# Patient Record
Sex: Male | Born: 2000 | Race: White | Hispanic: Yes | Marital: Single | Smoking: Never smoker
Health system: Southern US, Community
[De-identification: ages and names within clinical notes are randomized; demographics above are authoritative.]

## PROBLEM LIST (undated history)

## (undated) HISTORY — PX: TONSILLECTOMY AND ADENOIDECTOMY: SUR1326

---
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
## 2021-05-03 ENCOUNTER — Other Ambulatory Visit: Payer: Self-pay

## 2021-05-03 ENCOUNTER — Encounter (HOSPITAL_COMMUNITY): Payer: Self-pay | Admitting: Emergency Medicine

## 2021-05-03 ENCOUNTER — Ambulatory Visit (HOSPITAL_COMMUNITY)
Admission: EM | Admit: 2021-05-03 | Discharge: 2021-05-03 | Disposition: A | Discharge status: Home / Self Care | Payer: Medicaid Other | Attending: Emergency Medicine | Admitting: Emergency Medicine

## 2021-05-03 DIAGNOSIS — R519 Headache, unspecified: Secondary | ICD-10-CM

## 2021-05-03 MED ORDER — KETOROLAC TROMETHAMINE 30 MG/ML IJ SOLN
30.0000 mg | Freq: Once | INTRAMUSCULAR | Status: AC
  Administered 2021-05-03: 30 mg via INTRAMUSCULAR

## 2021-05-03 MED ORDER — KETOROLAC TROMETHAMINE 30 MG/ML IJ SOLN
INTRAMUSCULAR | Status: AC
  Filled 2021-05-03: qty 1

## 2021-05-03 NOTE — ED Triage Notes (Addendum)
Patient complains of having a headache for one week.  Patient says he wakes with headache  Complains of mood swings for a week.  Patient reports anger and sadness  Patient reports he has not tried any medications for headache

## 2021-05-03 NOTE — Discharge Instructions (Signed)
Drink at least 64 ounces of water a day. You can continue to take Tylenol and ibuprofen alternating at home.

## 2021-05-03 NOTE — ED Provider Notes (Signed)
[facility]  ____________________________________________  Time seen: Approximately 11:54 AM  I have reviewed the triage vital signs and the nursing notes.   HISTORY  Chief Complaint Headache   Historian Patient     HPI Daniel Landry is a 20 y.o. male presents to the urgent care with a headache that has occurred intermittently over the past week.  Patient reports that headaches are causing him to have mood swings and to feel sad.  Patient reports that he had to miss work due to headache.  He denies homicidal or suicidal ideation.  Denies history of headache.  States he is only drinking 2-3 bottles of water a day.  No falls or mechanisms of trauma.  No prior history of headaches in the past.  No fever, pharyngitis, nausea, vomiting or diarrhea.  No chest pain, chest tightness or abdominal pain.  Patient cannot recall his last eye exam.  He states he normally drinks water or juice and does not drink caffeine often.  He states that he has not attempted any alleviating measures at home including Tylenol and ibuprofen for headache.  History reviewed. No pertinent past medical history.   Immunizations up to date:  Yes.     History reviewed. No pertinent past medical history.  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 Authorizing Provider  naproxen (NAPROSYN) 500 MG tablet Take 1 tablet twice daily as needed for body aches or fever. Patient not taking: Reported on 05/03/2021 07/10/20   Molpus, Jonny Ruiz, MD  ondansetron (ZOFRAN ODT) 8 MG disintegrating tablet Take 1 tablet (8 mg total) by mouth every 8 (eight) hours as  needed for nausea or vomiting. 07/10/20   Molpus, Jonny Ruiz, MD    Allergies Patient has no known allergies.  Family History  Problem Relation Age of Onset   Hearing loss Maternal Grandfather        adult onset   Vision loss Maternal Grandfather 63       glasses    Social History Social History   Tobacco Use   Smoking status: Never   Smokeless tobacco: Never  Vaping Use   Vaping Use: Never used  Substance Use Topics   Alcohol use: Yes    Comment: rare   Drug use: Yes    Types: Marijuana     Review of Systems  Constitutional: No fever/chills Eyes:  No discharge ENT: No upper respiratory complaints. Respiratory: no cough. No SOB/ use of accessory muscles to breath Gastrointestinal:   No nausea, no vomiting.  No diarrhea.  No constipation. Musculoskeletal: Negative for musculoskeletal pain. Neuro: Patient has headache.  Skin: Negative for rash, abrasions, lacerations, ecchymosis.    ____________________________________________   PHYSICAL EXAM:  VITAL SIGNS: ED Triage Vitals  Enc Vitals Group     BP 05/03/21 1121 129/71     Pulse Rate 05/03/21 1121 63     Resp 05/03/21 1121 19     Temp 05/03/21 1121 98.6 F (37 C)     Temp Source 05/03/21 1121 Oral     SpO2 05/03/21 1121 99 %     Weight --      Height --  Head Circumference --      Peak Flow --      Pain Score 05/03/21 1117 10     Pain Loc --      Pain Edu? --      Excl. in GC? --      Constitutional: Alert and oriented. Well appearing and in no acute distress. Eyes: Conjunctivae are normal. PERRL. EOMI. Head: Atraumatic.  No frontal or maxillary sinus tenderness. ENT:      Nose: No congestion/rhinnorhea.      Mouth/Throat: Mucous membranes are moist.  Neck: No stridor.  No cervical spine tenderness to palpation. Cardiovascular: Normal rate, regular rhythm. Normal S1 and S2.  Good peripheral circulation. Respiratory: Normal respiratory effort without tachypnea or retractions. Lungs CTAB. Good air  entry to the bases with no decreased or absent breath sounds Gastrointestinal: Bowel sounds x 4 quadrants. Soft and nontender to palpation. No guarding or rigidity. No distention. Musculoskeletal: Full range of motion to all extremities. No obvious deformities noted Neurologic:  Normal for age. No gross focal neurologic deficits are appreciated.  Skin:  Skin is warm, dry and intact. No rash noted. Psychiatric: Mood and affect are normal for age. Speech and behavior are normal.   ____________________________________________   LABS (all labs ordered are listed, but only abnormal results are displayed)  Labs Reviewed - No data to display ____________________________________________  EKG   ____________________________________________  RADIOLOGY   No results found.  ____________________________________________    PROCEDURES  Procedure(s) performed:     Procedures     Medications  ketorolac (TORADOL) 30 MG/ML injection 30 mg (has no administration in time range)     ____________________________________________   INITIAL IMPRESSION / ASSESSMENT AND PLAN / ED COURSE  Pertinent labs & imaging results that were available during my care of the patient were reviewed by me and considered in my medical decision making (see chart for details).    Assessment and plan Headache 20 year old male presents to the emergency department with intermittent headache over the past week.  Vital signs are reassuring at triage.  No neurodeficits on exam.  We will treat with IM Toradol and recommend Tylenol and ibuprofen alternating at home.  Recommended increase hydration during work hours as patient works outside during the day.  Also recommended a yearly eye exam.  Return precautions were given to return with new or worsening symptoms.      ____________________________________________  FINAL CLINICAL IMPRESSION(S) / ED DIAGNOSES  Final diagnoses:  Acute nonintractable headache,  unspecified headache type      NEW MEDICATIONS STARTED DURING THIS VISIT:  ED Discharge Orders     None           This chart was dictated using voice recognition software/Dragon. Despite best efforts to proofread, errors can occur which can change the meaning. Any change was purely unintentional.     Orvil Feil, PA-C 05/03/21 1158

## 2022-04-06 ENCOUNTER — Ambulatory Visit (INDEPENDENT_AMBULATORY_CARE_PROVIDER_SITE_OTHER): Payer: Medicaid Other

## 2022-04-06 ENCOUNTER — Ambulatory Visit (HOSPITAL_COMMUNITY)
Admission: EM | Admit: 2022-04-06 | Discharge: 2022-04-06 | Disposition: A | Discharge status: Home / Self Care | Payer: Medicaid Other | Attending: Family Medicine | Admitting: Family Medicine

## 2022-04-06 DIAGNOSIS — M79645 Pain in left finger(s): Secondary | ICD-10-CM | POA: Diagnosis not present

## 2022-04-06 DIAGNOSIS — S6992XA Unspecified injury of left wrist, hand and finger(s), initial encounter: Secondary | ICD-10-CM

## 2022-04-06 DIAGNOSIS — S61201A Unspecified open wound of left index finger without damage to nail, initial encounter: Secondary | ICD-10-CM | POA: Diagnosis not present

## 2022-04-06 DIAGNOSIS — S61209A Unspecified open wound of unspecified finger without damage to nail, initial encounter: Secondary | ICD-10-CM

## 2022-04-06 MED ORDER — TETANUS-DIPHTH-ACELL PERTUSSIS 5-2.5-18.5 LF-MCG/0.5 IM SUSY
0.5000 mL | PREFILLED_SYRINGE | Freq: Once | INTRAMUSCULAR | Status: AC
  Administered 2022-04-06: 0.5 mL via INTRAMUSCULAR

## 2022-04-06 MED ORDER — TETANUS-DIPHTH-ACELL PERTUSSIS 5-2.5-18.5 LF-MCG/0.5 IM SUSY
PREFILLED_SYRINGE | INTRAMUSCULAR | Status: AC
  Filled 2022-04-06: qty 0.5

## 2022-04-06 NOTE — ED Triage Notes (Signed)
Pt injured his left-hand index finger. 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

## 2022-04-06 NOTE — Discharge Instructions (Addendum)
Apply ice to finger. Take tylenol and or ibuprofen. Apply neosporin twice daily and change bandage. Continue treatment for 5-7 days or until wound completely heals.

## 2022-04-06 NOTE — ED Provider Notes (Signed)
Daniel Landry    CSN: [id number] Arrival date & time: 04/06/22  1514      History   Chief Complaint Chief Complaint  Patient presents with   Finger Injury    HPI Daniel Landry is a 21 y.o. male.   HPI Patient presents for evaluation of left index finger injury.  To clarify, patient injured finger with his nail gun.  He reports, the nail from the nail gun punctured the tip of his left finger. His last tetanus was over 7 years old.  He denies any loss of sensation involving the left index finger.  No past medical history on file.  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 Authorizing Provider  naproxen (NAPROSYN) 500 MG tablet Take 1 tablet twice daily as needed for body aches or fever. Patient not taking: Reported on 05/03/2021 07/10/20   Molpus, Jonny Ruiz, MD  ondansetron (ZOFRAN ODT) 8 MG disintegrating tablet Take 1 tablet (8 mg total) by mouth every 8 (eight) hours as needed for nausea or vomiting. 07/10/20   Molpus, John, MD    Family History Family History  Problem Relation Age of Onset   Hearing loss Maternal Grandfather        adult onset   Vision loss Maternal Grandfather 73       glasses    Social History Social History   Tobacco Use   Smoking status: Never   Smokeless tobacco: Never  Vaping Use   Vaping Use: Never used  Substance Use Topics   Alcohol use: Yes    Comment: rare   Drug use: Yes    Types: Marijuana     Allergies   Patient has no known allergies.   Review of Systems Review of Systems Pertinent negatives listed in HPI   Physical  Exam Triage Vital Signs ED Triage Vitals  Enc Vitals Group     BP 04/06/22 1632 117/61     Pulse Rate 04/06/22 1630 71     Resp 04/06/22 1630 16     Temp 04/06/22 1630 98.8 F (37.1 C)     Temp src --      SpO2 04/06/22 1630 98 %     Weight --      Height --      Head Circumference --      Peak Flow --      Pain Score --      Pain Loc --      Pain Edu? --      Excl. in GC? --    No data found.  Updated Vital Signs BP 117/61 (BP Location: Left Arm)   Pulse 71   Temp 98.8 F (37.1 C)   Resp 16   SpO2 100%   Visual Acuity Right Eye Distance:   Left Eye Distance:   Bilateral Distance:    Right Eye Near:   Left Eye Near:    Bilateral Near:     Physical Exam Vitals reviewed.  Constitutional:      Appearance: Normal appearance.  HENT:     Head: Normocephalic and  atraumatic.  Eyes:     Extraocular Movements: Extraocular movements intact.     Pupils: Pupils are equal, round, and reactive to light.  Cardiovascular:     Rate and Rhythm: Normal rate and regular rhythm.  Pulmonary:     Effort: Pulmonary effort is normal.     Breath sounds: Normal breath sounds.  Musculoskeletal:       Arms:  Neurological:     General: No focal deficit present.     Mental Status: He is alert and oriented to person, place, and time.  Psychiatric:        Mood and Affect: Mood normal.        Behavior: Behavior normal.        Thought Content: Thought content normal.        Judgment: Judgment normal.   UC Treatments / Results  Labs (all labs ordered are listed, but only abnormal results are displayed) Labs Reviewed - No data to display  EKG   Radiology DG Hand Complete Left  Result Date: 04/06/2022 CLINICAL DATA:  Injury left index finger EXAM: LEFT HAND - COMPLETE 3+ VIEW COMPARISON:  None Available. FINDINGS: There is no evidence of fracture or dislocation. There is no evidence of arthropathy or other focal bone abnormality. Soft tissues are unremarkable. IMPRESSION:  Negative. Electronically Signed   By: Marlan Palau M.D.   On: 04/06/2022 17:08    Procedures Procedures (including critical care time)  Medications Ordered in UC Medications  Tdap (BOOSTRIX) injection 0.5 mL (0.5 mLs Intramuscular Given 04/06/22 1746)    Initial Impression / Assessment and Plan / UC Course  I have reviewed the triage vital signs and the nursing notes.  Pertinent labs & imaging results that were available during my care of the patient were reviewed by me and considered in my medical decision making (see chart for details).    Injury of left hand and avulsion of skin of finger No repair warranted. TDAP updated. Apply neosporin to wound twice daily. Monitor for signs of infection. Return as needed. Final Clinical Impressions(s) / UC Diagnoses   Final diagnoses:  Injury of finger of left hand, initial encounter  Avulsion of skin of finger, initial encounter     Discharge Instructions      Apply ice to finger. Take tylenol and or ibuprofen. Apply neosporin twice daily and change bandage. Continue treatment for 5-7 days or until wound completely heals.    ED Prescriptions   None    PDMP not reviewed this encounter.   Bing Neighbors, FNP 04/07/22 [phone]

## 2022-05-14 ENCOUNTER — Ambulatory Visit (HOSPITAL_COMMUNITY): Payer: Self-pay

## 2022-05-14 ENCOUNTER — Encounter (HOSPITAL_COMMUNITY): Payer: Self-pay | Admitting: Emergency Medicine

## 2022-05-14 ENCOUNTER — Ambulatory Visit (HOSPITAL_COMMUNITY)
Admission: EM | Admit: 2022-05-14 | Discharge: 2022-05-14 | Disposition: A | Discharge status: Home / Self Care | Payer: Medicaid Other | Attending: Internal Medicine | Admitting: Internal Medicine

## 2022-05-14 DIAGNOSIS — K047 Periapical abscess without sinus: Secondary | ICD-10-CM | POA: Diagnosis not present

## 2022-05-14 DIAGNOSIS — K0889 Other specified disorders of teeth and supporting structures: Secondary | ICD-10-CM | POA: Diagnosis not present

## 2022-05-14 DIAGNOSIS — R220 Localized swelling, mass and lump, head: Secondary | ICD-10-CM | POA: Diagnosis not present

## 2022-05-14 MED ORDER — AMOXICILLIN-POT CLAVULANATE 875-125 MG PO TABS: 1.0000 | ORAL_TABLET | Freq: Two times a day (BID) | ORAL | 0 refills | Status: DC

## 2022-05-14 MED ORDER — IBUPROFEN 600 MG PO TABS: 600.0000 mg | ORAL_TABLET | Freq: Four times a day (QID) | ORAL | 0 refills | Status: AC | PRN

## 2022-05-14 NOTE — Discharge Instructions (Signed)
Take Augmentin antibiotic twice daily for the next 7 days to treat your suspected dental infection. Take ibuprofen 600 mg every 6 hours as needed with food for your dental pain and inflammation.  If you develop any new or worsening symptoms including shortness of breath, difficulty eating/breathing, difficulty swallowing, increasing warmth or drainage from your mouth, or increasing swelling, please return to urgent care.  If your symptoms are severe, please go to the emergency room.  I hope you feel better!  Call your dentist on Monday to schedule an appointment!

## 2022-05-14 NOTE — ED Provider Notes (Signed)
[facility]    CSN: [id number] Arrival date & time: 05/14/22  1037      History   Chief Complaint Chief Complaint  Patient presents with   Dental Pain   Facial Swelling    HPI Daniel Landry is a 21 y.o. male.   Patient presents to urgent care for evaluation of dental pain and facial swelling that started yesterday.  Pain is to the front teeth on the left side.  Area of greatest tenderness is to the lateral aspect of the left nare.  He states that he notices increasing left cheek swelling as well since yesterday.  He denies shortness of breath, difficulty breathing, sore throat, difficulty swallowing, purulent drainage from mouth, bloody drainage from mouth, and difficulty eating.  No nausea, vomiting, neck pain, nasal congestion, cough, fever/chills, or dizziness reported.  He took Tylenol this morning for the pain without much relief.  He recently had a cavity filled in the area approximately 1 month ago.  Denies any other dental work, falls, or trauma to the mouth since then.  Pain is currently a 5 on a scale of 0-10 at this time to to the soft tissue immediately left of the nose.      History reviewed. No pertinent past medical history.  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 Authorizing Provider  amoxicillin-clavulanate (AUGMENTIN) 875-125 MG tablet Take 1 tablet by mouth every 12 (twelve) hours. 05/14/22  Yes Carlisle Beers, FNP  ibuprofen (ADVIL) 600 MG tablet Take 1 tablet (600 mg total) by mouth every 6 (six) hours as needed. 05/14/22  Yes Carlisle Beers, FNP   ondansetron (ZOFRAN ODT) 8 MG disintegrating tablet Take 1 tablet (8 mg total) by mouth every 8 (eight) hours as needed for nausea or vomiting. 07/10/20   Molpus, John, MD    Family History Family History  Problem Relation Age of Onset   Hearing loss Maternal Grandfather        adult onset   Vision loss Maternal Grandfather 52       glasses    Social History Social History   Tobacco Use   Smoking status: Never   Smokeless tobacco: Never  Vaping Use   Vaping Use: Never used  Substance Use Topics   Alcohol use: Yes    Comment: rare   Drug use: Yes    Types: Marijuana     Allergies   Patient has no known allergies.   Review of Systems Review of Systems Per HPI  Physical Exam Triage Vital Signs ED Triage Vitals  Enc Vitals Group     BP 05/14/22 1155 137/79     Pulse Rate 05/14/22 1155 (!) 56     Resp 05/14/22 1155 18     Temp 05/14/22 1155 98 F (36.7 C)     Temp Source 05/14/22 1155 Oral     SpO2 05/14/22 1155 100 %     Weight --      Height --      Head Circumference --      Peak Flow --  Pain Score 05/14/22 1154 5     Pain Loc --      Pain Edu? --      Excl. in GC? --    No data found.  Updated Vital Signs BP 137/79 (BP Location: Left Arm)   Pulse (!) 56   Temp 98 F (36.7 C) (Oral)   Resp 18   SpO2 100%   Visual Acuity Right Eye Distance:   Left Eye Distance:   Bilateral Distance:    Right Eye Near:   Left Eye Near:    Bilateral Near:     Physical Exam Vitals and nursing note reviewed.  Constitutional:      Appearance: Normal appearance. He is not ill-appearing or toxic-appearing.     Comments: Very pleasant patient sitting on exam in position of comfort table in no acute distress.   HENT:     Head: Normocephalic and atraumatic.     Right Ear: Hearing, tympanic membrane, ear canal and external ear normal.     Left Ear: Hearing, tympanic membrane, ear canal and external ear normal.     Nose:     Right Turbinates: Not swollen.      Left Turbinates: Not swollen.     Right Sinus: No maxillary sinus tenderness or frontal sinus tenderness.     Left Sinus: Maxillary sinus tenderness present. No frontal sinus tenderness.     Mouth/Throat:     Lips: Pink.     Mouth: Mucous membranes are moist. No oral lesions or angioedema.     Dentition: Dental tenderness present.     Pharynx: Uvula midline. No posterior oropharyngeal erythema.      Comments: Erythema to the oral mucosa of the frontal upper aspect of the mouth and gumline.  No abscess palpated to the upper aspect of the mouth near patient's area of greatest tenderness.  Area of patient's tooth ache is outlined in red above.  Area of greatest tenderness to the maxillofacial area is also outlined and read above to the left of the nose.  No redness, bruising, or drainage to the soft tissues of the face noticed. Mild maxillofacial swelling to the left side of the patient's face present.  Defer to image below for further detail.  Eyes:     General: Lids are normal. Vision grossly intact. Gaze aligned appropriately.     Extraocular Movements: Extraocular movements intact.     Conjunctiva/sclera: Conjunctivae normal.     Right eye: Right conjunctiva is not injected.     Left eye: Left conjunctiva is not injected.  Pulmonary:     Effort: Pulmonary effort is normal.  Abdominal:     Palpations: Abdomen is soft.  Musculoskeletal:     Cervical back: Neck supple.  Skin:    General: Skin is warm and dry.     Capillary Refill: Capillary refill takes less than 2 seconds.     Findings: No rash.  Neurological:     General: No focal deficit present.     Mental Status: He is alert and oriented to person, place, and time. Mental status is at baseline.     Cranial Nerves: No dysarthria or facial asymmetry.     Gait: Gait is intact.  Psychiatric:        Mood and Affect: Mood normal.        Speech: Speech normal.        Behavior: Behavior normal.        Thought Content: Thought content  normal.  Judgment: Judgment normal.          UC Treatments / Results  Labs (all labs ordered are listed, but only abnormal results are displayed) Labs Reviewed - No data to display  EKG   Radiology No results found.  Procedures Procedures (including critical care time)  Medications Ordered in UC Medications - No data to display  Initial Impression / Assessment and Plan / UC Course  I have reviewed the triage vital signs and the nursing notes.  Pertinent labs & imaging results that were available during my care of the patient were reviewed by me and considered in my medical decision making (see chart for details).  1.  Facial swelling and dental infection No palpable dental abscess to the oral mucosa. No pain with EOM exam.  No allergies to antibiotics or medications per patient.  Plan to treat for probable dental infection with Augmentin twice daily for the next 7 days.  Patient may take ibuprofen 600 mg every 6 hours as needed for inflammation and pain with food to avoid GI upset.  Patient to call his dentist on Monday to schedule an appointment for follow-up and further evaluation of tooth pain and possible dental infection.  Strict ED return precautions given.   Discussed physical exam and available lab work findings in clinic with patient.  Counseled patient regarding appropriate use of medications and potential side effects for all medications recommended or prescribed today. Discussed red flag signs and symptoms of worsening condition,when to call the PCP office, return to urgent care, and when to seek higher level of care in the emergency department. Patient verbalizes understanding and agreement with plan. All questions answered. Patient discharged in stable condition.  Final Clinical Impressions(s) / UC Diagnoses   Final diagnoses:  Facial swelling  Dental infection  Pain, dental     Discharge Instructions      Take Augmentin antibiotic twice daily for  the next 7 days to treat your suspected dental infection. Take ibuprofen 600 mg every 6 hours as needed with food for your dental pain and inflammation.  If you develop any new or worsening symptoms including shortness of breath, difficulty eating/breathing, difficulty swallowing, increasing warmth or drainage from your mouth, or increasing swelling, please return to urgent care.  If your symptoms are severe, please go to the emergency room.  I hope you feel better!  Call your dentist on Monday to schedule an appointment!     ED Prescriptions     Medication Sig Dispense Auth. Provider   ibuprofen (ADVIL) 600 MG tablet Take 1 tablet (600 mg total) by mouth every 6 (six) hours as needed. 30 tablet Talbot Grumbling, FNP   amoxicillin-clavulanate (AUGMENTIN) 875-125 MG tablet Take 1 tablet by mouth every 12 (twelve) hours. 14 tablet Talbot Grumbling, FNP      PDMP not reviewed this encounter.   Talbot Grumbling, [place] 05/14/22 1258

## 2022-05-14 NOTE — ED Triage Notes (Signed)
Pt presents with left side facial swelling and dental pain. States he had dental procedure a month ago and states the tooth may have chipped. Noticed facial swelling yesterday,

## 2022-05-20 ENCOUNTER — Encounter (HOSPITAL_COMMUNITY): Payer: Self-pay | Admitting: Emergency Medicine

## 2022-05-20 ENCOUNTER — Ambulatory Visit (HOSPITAL_COMMUNITY): Payer: Self-pay

## 2022-05-20 ENCOUNTER — Ambulatory Visit (HOSPITAL_COMMUNITY)
Admission: EM | Admit: 2022-05-20 | Discharge: 2022-05-20 | Disposition: A | Discharge status: Home / Self Care | Payer: Medicaid Other | Attending: Physician Assistant | Admitting: Physician Assistant

## 2022-05-20 DIAGNOSIS — Z760 Encounter for issue of repeat prescription: Secondary | ICD-10-CM

## 2022-05-20 DIAGNOSIS — K047 Periapical abscess without sinus: Secondary | ICD-10-CM | POA: Diagnosis not present

## 2022-05-20 MED ORDER — AMOXICILLIN-POT CLAVULANATE 875-125 MG PO TABS: 1.0000 | ORAL_TABLET | Freq: Two times a day (BID) | ORAL | 0 refills | Status: AC

## 2022-05-20 NOTE — ED Triage Notes (Signed)
Patient presents for medication refill.   Patient states he was seen previously at this clinic for mouth infection per patient statement.   Patient states the medicine that was given wasn't enough per prescription. Patient states the pharmacy did not dispense enough medication.   Patient was prescribed Augmentin, patient has taken 4 days worth.

## 2022-05-20 NOTE — ED Provider Notes (Signed)
[facility]    CSN: [id number] Arrival date & time: 05/20/22  1432      History   Chief Complaint Chief Complaint  Patient presents with   Medication Refill    HPI Daniel Landry is a 21 y.o. male.   Patient presents today requesting a refill of Augmentin.  He was seen 05/14/2022 at which point he had a dental infection was started on Augmentin twice daily.  Reports that he was only given 4 days of medication with last dose within the past 48 hours.  He tried to contact the pharmacy for the rest of his medication but they stated he needed to be seen.  He was evaluated by dentist who encouraged him to complete course of antibiotics.  Since starting medication patient has had improvement of symptoms.  He continues to have an area of drainage and swelling but this has become less painful.  Pain is rated 2 on a 0-10 pain scale, described as throbbing, no aggravating alleviating factors notified.  He is able to eat and drink normally.  He has been taking ibuprofen as prescribed with improvement of symptoms.  Denies any dysphagia, odynophagia, swelling of his throat, shortness of breath, fever.    History reviewed. No pertinent past medical history.  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 Authorizing Provider  amoxicillin-clavulanate (AUGMENTIN) 875-125 MG tablet Take 1 tablet by mouth every 12 (twelve) hours. 05/20/22   Donyea Beverlin, Noberto Retort, PA-C  ibuprofen (ADVIL) 600 MG tablet Take 1 tablet (600 mg total) by mouth every 6 (six) hours as needed. 05/14/22   Carlisle Beers, FNP  ondansetron  (ZOFRAN ODT) 8 MG disintegrating tablet Take 1 tablet (8 mg total) by mouth every 8 (eight) hours as needed for nausea or vomiting. 07/10/20   Molpus, John, MD    Family History Family History  Problem Relation Age of Onset   Hearing loss Maternal Grandfather        adult onset   Vision loss Maternal Grandfather 4       glasses    Social History Social History   Tobacco Use   Smoking status: Never   Smokeless tobacco: Never  Vaping Use   Vaping Use: Never used  Substance Use Topics   Alcohol use: Yes    Comment: rare   Drug use: Yes    Types: Marijuana     Allergies   Patient has no known allergies.   Review of Systems Review of Systems  Constitutional:  Positive for activity change. Negative for appetite change, fatigue and fever.  HENT:  Positive for dental problem and facial swelling. Negative for congestion, sinus pressure, sneezing, sore throat, trouble swallowing and voice change.   Respiratory:  Negative for cough and shortness of breath.   Cardiovascular:  Negative for chest pain.  Gastrointestinal:  Negative for abdominal pain, diarrhea, nausea and vomiting.     Physical Exam Triage Vital Signs ED Triage Vitals  Enc Vitals Group     BP 05/20/22 1533 (!) 126/57     Pulse Rate 05/20/22 1533 63  Resp 05/20/22 1533 16     Temp 05/20/22 1533 98.2 F (36.8 C)     Temp Source 05/20/22 1533 Oral     SpO2 05/20/22 1533 97 %     Weight --      Height --      Head Circumference --      Peak Flow --      Pain Score 05/20/22 1538 0     Pain Loc --      Pain Edu? --      Excl. in GC? --    No data found.  Updated Vital Signs BP (!) 126/57 (BP Location: Left Arm)   Pulse 63   Temp 98.2 F (36.8 C) (Oral)   Resp 16   SpO2 97%   Visual Acuity Right Eye Distance:   Left Eye Distance:   Bilateral Distance:    Right Eye Near:   Left Eye Near:    Bilateral Near:     Physical Exam Vitals reviewed.  Constitutional:      General: He is awake.      Appearance: Normal appearance. He is well-developed. He is not ill-appearing.     Comments: Very pleasant male appears stated age in no acute distress sitting comfortably in exam room  HENT:     Head: Normocephalic and atraumatic.     Mouth/Throat:     Dentition: Gingival swelling and dental abscesses present.     Pharynx: Uvula midline. No oropharyngeal exudate or posterior oropharyngeal erythema.      Comments: No evidence of Ludwig angina. Cardiovascular:     Rate and Rhythm: Normal rate and regular rhythm.     Heart sounds: Normal heart sounds, S1 normal and S2 normal. No murmur heard. Pulmonary:     Effort: Pulmonary effort is normal.     Breath sounds: Normal breath sounds. No stridor. No wheezing, rhonchi or rales.     Comments: Clear to auscultation bilaterally Abdominal:     Palpations: Abdomen is soft.     Tenderness: There is no abdominal tenderness.  Neurological:     Mental Status: He is alert.  Psychiatric:        Behavior: Behavior is cooperative.      UC Treatments / Results  Labs (all labs ordered are listed, but only abnormal results are displayed) Labs Reviewed - No data to display  EKG   Radiology No results found.  Procedures Procedures (including critical care time)  Medications Ordered in UC Medications - No data to display  Initial Impression / Assessment and Plan / UC Course  I have reviewed the triage vital signs and the nursing notes.  Pertinent labs & imaging results that were available during my care of the patient were reviewed by me and considered in my medical decision making (see chart for details).     Evidence of dental infection still present.  Discussed that we typically continue antibiotics for 7 to 10 days.  Patient has had 4 days of medication but has been unable to get the rest of prescription from pharmacy despite this having been written for 7 days.  We will send another 7-day prescription to pharmacy.  Recommended he  continue over-the-counter medications including ibuprofen and Tylenol.  He is to gargle with warm salt water.  Discussed that he should follow-up with a dentist for further evaluation.  If he develops any worsening symptoms including swelling of his throat, shortness of breath, dysphagia, odynophagia, fever he needs to be seen immediately.  Strict return precautions given.  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  Final Clinical Impressions(s) / UC Diagnoses   Final diagnoses:  Dental infection  Medication refill     Discharge Instructions      I have called in another week of your antibiotic.  Take this twice a day with food.  It can upset your stomach.  Gargle with warm salt water and use Tylenol ibuprofen for pain.  You should follow-up with a dentist as we discussed.  If you have any worsening symptoms including swelling of your throat, difficulty speaking, difficulty swallowing, shortness of breath you need to be seen immediately.     ED Prescriptions     Medication Sig Dispense Auth. Provider   amoxicillin-clavulanate (AUGMENTIN) 875-125 MG tablet Take 1 tablet by mouth every 12 (twelve) hours. 14 tablet Dorinda Stehr, Noberto Retort, PA-C      PDMP not reviewed this encounter.   Jeani Hawking, PA-C 05/20/22 1557

## 2022-05-20 NOTE — Discharge Instructions (Signed)
I have called in another week of your antibiotic.  Take this twice a day with food.  It can upset your stomach.  Gargle with warm salt water and use Tylenol ibuprofen for pain.  You should follow-up with a dentist as we discussed.  If you have any worsening symptoms including swelling of your throat, difficulty speaking, difficulty swallowing, shortness of breath you need to be seen immediately.

## 2023-06-21 ENCOUNTER — Ambulatory Visit
Admission: EM | Admit: 2023-06-21 | Discharge: 2023-06-21 | Disposition: A | Discharge status: Home / Self Care | Payer: Medicaid Other

## 2023-06-21 DIAGNOSIS — R112 Nausea with vomiting, unspecified: Secondary | ICD-10-CM | POA: Diagnosis not present

## 2023-06-21 DIAGNOSIS — R197 Diarrhea, unspecified: Secondary | ICD-10-CM | POA: Diagnosis not present

## 2023-06-21 MED ORDER — ONDANSETRON 4 MG PO TBDP: 4.0000 mg | ORAL_TABLET | Freq: Three times a day (TID) | ORAL | 0 refills | Status: AC | PRN

## 2023-06-21 MED ORDER — LOPERAMIDE HCL 2 MG PO CAPS: 2.0000 mg | ORAL_CAPSULE | Freq: Four times a day (QID) | ORAL | 0 refills | Status: AC | PRN

## 2023-06-21 MED ORDER — ONDANSETRON 4 MG PO TBDP
4.0000 mg | ORAL_TABLET | Freq: Once | ORAL | Status: AC
  Administered 2023-06-21: 4 mg via ORAL

## 2023-06-21 NOTE — ED Provider Notes (Signed)
[facility]    CSN: [id number] Arrival date & time: 06/21/23  1357      History   Chief Complaint No chief complaint on file.   HPI Daniel Landry is a 22 y.o. male.   Patient presenting today with new onset nausea vomiting and diarrhea since awaking this morning.  Denies fever, chills, abdominal pain, urinary symptoms, upper respiratory symptoms.  Tried some Pepto-Bismol but could not keep it down.  Currently not tolerating p.o. well.  Ate some [place] food last night and thinks this is what is causing symptoms.  No known sick contacts or new medications.    History reviewed. No pertinent past medical history.  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 Authorizing Provider  bismuth subsalicylate (PEPTO BISMOL) 262 MG chewable tablet Chew 524 mg by mouth as needed.   Yes [provider]  loperamide (IMODIUM) 2 MG capsule Take 1 capsule (2 mg total) by mouth 4 (four) times daily as needed for diarrhea or loose stools. 06/21/23  Yes Particia Nearing, PA-C  ondansetron (ZOFRAN-ODT) 4 MG disintegrating tablet Take 1 tablet (4 mg total) by mouth every 8 (eight) hours as needed for nausea or vomiting. 06/21/23  Yes Particia Nearing, PA-C  amoxicillin-clavulanate (AUGMENTIN) 875-125 MG tablet Take 1 tablet by mouth every 12 (twelve) hours. 05/20/22   Raspet, Noberto Retort, PA-C  ibuprofen (ADVIL) 600 MG tablet Take 1 tablet (600 mg total) by mouth every 6 (six) hours as needed. 05/14/22   Carlisle Beers, FNP  ondansetron (ZOFRAN ODT) 8 MG disintegrating tablet Take 1 tablet  (8 mg total) by mouth every 8 (eight) hours as needed for nausea or vomiting. 07/10/20   Molpus, John, MD    Family History Family History  Problem Relation Age of Onset   Hearing loss Maternal Grandfather        adult onset   Vision loss Maternal Grandfather 37       glasses    Social History Social History   Tobacco Use   Smoking status: Never   Smokeless tobacco: Never  Vaping Use   Vaping status: Never Used  Substance Use Topics   Alcohol use: Yes    Comment: rare   Drug use: Yes    Types: Marijuana     Allergies   Patient has no known allergies.   Review of Systems Review of Systems PER HPI  Physical Exam Triage Vital Signs ED Triage Vitals  Encounter Vitals Group     BP 06/21/23 1428 130/76     Systolic BP Percentile --      Diastolic BP Percentile --      Pulse Rate 06/21/23 1428 83     Resp 06/21/23 1428 15     Temp 06/21/23 1428 99.8 F (37.7 C)     Temp Source 06/21/23 1428 Oral     SpO2 06/21/23 1428 97 %     Weight --      Height --      Head Circumference --      Peak Flow --  Pain Score 06/21/23 1430 0     Pain Loc --      Pain Education --      Exclude from Growth Chart --    No data found.  Updated Vital Signs BP 130/76 (BP Location: Right Arm)   Pulse 83   Temp 99.8 F (37.7 C) (Oral)   Resp 15   SpO2 97%   Visual Acuity Right Eye Distance:   Left Eye Distance:   Bilateral Distance:    Right Eye Near:   Left Eye Near:    Bilateral Near:     Physical Exam Vitals and nursing note reviewed.  Constitutional:      Appearance: Normal appearance.  HENT:     Head: Atraumatic.     Mouth/Throat:     Mouth: Mucous membranes are moist.     Pharynx: Oropharynx is clear.  Eyes:     Extraocular Movements: Extraocular movements intact.     Conjunctiva/sclera: Conjunctivae normal.  Cardiovascular:     Rate and Rhythm: Normal rate and regular rhythm.  Pulmonary:     Effort: Pulmonary effort is normal.     Breath sounds:  Normal breath sounds.  Abdominal:     General: Bowel sounds are normal. There is no distension.     Palpations: Abdomen is soft.     Tenderness: There is no abdominal tenderness. There is no right CVA tenderness, left CVA tenderness or guarding.  Musculoskeletal:        General: Normal range of motion.     Cervical back: Normal range of motion and neck supple.  Skin:    General: Skin is warm and dry.  Neurological:     General: No focal deficit present.     Mental Status: He is oriented to person, place, and time.  Psychiatric:        Mood and Affect: Mood normal.        Thought Content: Thought content normal.        Judgment: Judgment normal.      UC Treatments / Results  Labs (all labs ordered are listed, but only abnormal results are displayed) Labs Reviewed - No data to display  EKG   Radiology No results found.  Procedures Procedures (including critical care time)  Medications Ordered in UC Medications  ondansetron (ZOFRAN-ODT) disintegrating tablet 4 mg (4 mg Oral Given 06/21/23 1502)    Initial Impression / Assessment and Plan / UC Course  I have reviewed the triage vital signs and the nursing notes.  Pertinent labs & imaging results that were available during my care of the patient were reviewed by me and considered in my medical decision making (see chart for details).     Vitals and exam reassuring with no red flag findings.  Zofran given in clinic for active nausea, Zofran, Imodium, brat diet, fluids.  Work note given.  Return for worsening symptoms.  Final Clinical Impressions(s) / UC Diagnoses   Final diagnoses:  Nausea vomiting and diarrhea   Discharge Instructions   None    ED Prescriptions     Medication Sig Dispense Auth. Provider   ondansetron (ZOFRAN-ODT) 4 MG disintegrating tablet Take 1 tablet (4 mg total) by mouth every 8 (eight) hours as needed for nausea or vomiting. 20 tablet Particia Nearing, [place]   loperamide (IMODIUM) 2  MG capsule Take 1 capsule (2 mg total) by mouth 4 (four) times daily as needed for diarrhea or loose stools. 12 capsule Particia Nearing, [place]  PDMP not reviewed this encounter.   Particia Nearing, [place] 06/21/23 1515

## 2023-06-21 NOTE — ED Triage Notes (Signed)
Pt c/o Nausea,vomiting, diarrhea since early this morning.

## 2023-07-04 ENCOUNTER — Emergency Department (HOSPITAL_COMMUNITY): Payer: Medicaid Other

## 2023-07-04 ENCOUNTER — Encounter (HOSPITAL_COMMUNITY): Payer: Self-pay

## 2023-07-04 ENCOUNTER — Emergency Department (HOSPITAL_COMMUNITY)
Admission: EM | Admit: 2023-07-04 | Discharge: 2023-07-04 | Disposition: A | Discharge status: Home / Self Care | Payer: Medicaid Other | Attending: Emergency Medicine | Admitting: Emergency Medicine

## 2023-07-04 ENCOUNTER — Other Ambulatory Visit: Payer: Self-pay

## 2023-07-04 DIAGNOSIS — S32018A Other fracture of first lumbar vertebra, initial encounter for closed fracture: Secondary | ICD-10-CM | POA: Diagnosis not present

## 2023-07-04 DIAGNOSIS — W11XXXA Fall on and from ladder, initial encounter: Secondary | ICD-10-CM | POA: Insufficient documentation

## 2023-07-04 DIAGNOSIS — S3992XA Unspecified injury of lower back, initial encounter: Secondary | ICD-10-CM | POA: Diagnosis present

## 2023-07-04 DIAGNOSIS — S32009A Unspecified fracture of unspecified lumbar vertebra, initial encounter for closed fracture: Secondary | ICD-10-CM | POA: Insufficient documentation

## 2023-07-04 DIAGNOSIS — S32010D Wedge compression fracture of first lumbar vertebra, subsequent encounter for fracture with routine healing: Secondary | ICD-10-CM | POA: Diagnosis not present

## 2023-07-04 DIAGNOSIS — M545 Low back pain, unspecified: Secondary | ICD-10-CM | POA: Diagnosis not present

## 2023-07-04 DIAGNOSIS — S32010A Wedge compression fracture of first lumbar vertebra, initial encounter for closed fracture: Secondary | ICD-10-CM | POA: Diagnosis not present

## 2023-07-04 MED ORDER — ONDANSETRON 4 MG PO TBDP
8.0000 mg | ORAL_TABLET | Freq: Once | ORAL | Status: AC
  Administered 2023-07-04: 8 mg via ORAL

## 2023-07-04 MED ORDER — OXYCODONE-ACETAMINOPHEN 5-325 MG PO TABS
1.0000 | ORAL_TABLET | Freq: Once | ORAL | Status: AC
  Administered 2023-07-04: 1 via ORAL
  Filled 2023-07-04: qty 1

## 2023-07-04 MED ORDER — OXYCODONE HCL 5 MG PO TABS: 5.0000 mg | ORAL_TABLET | ORAL | 0 refills | Status: AC | PRN

## 2023-07-04 MED ORDER — ONDANSETRON HCL 4 MG/2ML IJ SOLN: 4.0000 mg | Freq: Once | INTRAMUSCULAR | Status: DC

## 2023-07-04 MED ORDER — ONDANSETRON 4 MG PO TBDP
ORAL_TABLET | ORAL | Status: AC
  Filled 2023-07-04: qty 1

## 2023-07-04 MED ORDER — OXYCODONE HCL 5 MG PO TABS: 5.0000 mg | ORAL_TABLET | ORAL | 0 refills | Status: DC | PRN

## 2023-07-04 MED ORDER — HYDROCODONE-ACETAMINOPHEN 5-325 MG PO TABS
1.0000 | ORAL_TABLET | Freq: Once | ORAL | Status: AC
  Administered 2023-07-04: 1 via ORAL
  Filled 2023-07-04: qty 1

## 2023-07-04 NOTE — Progress Notes (Signed)
Orthopedic Tech Progress Note Patient Details:  Daniel Landry [birth year] [id number]  Ortho Devices Type of Ortho Device: Thoracolumbar corset (TLSO) Ortho Device/Splint Location: BACK Ortho Device/Splint Interventions: Ordered   Post Interventions Patient Tolerated: Well Instructions Provided: Adjustment of device, Care of device  Daniel Landry [name] 07/04/2023, 7:41 PM

## 2023-07-04 NOTE — ED Provider Notes (Cosign Needed Addendum)
[place] EMERGENCY DEPARTMENT AT [facility] Provider Note   CSN: [id number] Arrival date & time: 07/04/23  1547     History  Chief Complaint  Patient presents with   Fall   Back Pain    Daniel Landry is a 22 y.o. male.  22 year old male presents with his sister today following a fall that occurred just prior to arrival.  He was working on his house.  He states he was using a ladder to work on the roof.  The ladder slipped causing him to fall backwards and fall directly on the ladder hitting his back first.  No head injury or loss of consciousness.  Denies any other complaints.  Does report mid to low back pain.  Has taken some pain medication that he was given earlier with some improvement in pain.  The history is provided by the patient. No language interpreter was used.       Home Medications Prior to Admission medications   Medication Sig Start Date End Date Taking? Authorizing Provider  amoxicillin-clavulanate (AUGMENTIN) 875-125 MG tablet Take 1 tablet by mouth every 12 (twelve) hours. 05/20/22   Raspet, Noberto Retort, PA-C  bismuth subsalicylate (PEPTO BISMOL) 262 MG chewable tablet Chew 524 mg by mouth as needed.    [provider]  ibuprofen (ADVIL) 600 MG tablet Take 1 tablet (600 mg total) by mouth every 6 (six) hours as needed. 05/14/22   Carlisle Beers, FNP  loperamide (IMODIUM) 2 MG capsule Take 1 capsule (2 mg total) by mouth 4 (four) times daily as needed for diarrhea or loose stools. 06/21/23   Particia Nearing, PA-C  ondansetron (ZOFRAN ODT) 8 MG disintegrating tablet Take 1 tablet (8 mg total) by mouth every 8 (eight) hours as needed for nausea or vomiting. 07/10/20   Molpus, John, MD  ondansetron (ZOFRAN-ODT) 4 MG disintegrating tablet Take 1 tablet (4 mg total) by mouth every 8 (eight) hours as needed for nausea or vomiting. 06/21/23   Particia Nearing, PA-C      Allergies    Patient has no known allergies.    Review of  Systems   Review of Systems  Constitutional:  Negative for chills and fever.  Respiratory:  Negative for shortness of breath.   Musculoskeletal:  Positive for back pain.  Neurological:  Negative for syncope and light-headedness.  All other systems reviewed and are negative.   Physical Exam Updated Vital Signs BP 126/61   Pulse 66   Temp 99.1 F (37.3 C) (Oral)   Resp 19   Ht 5\' 7"  (1.702 m)   Wt 99.8 kg   SpO2 100%   BMI 34.46 kg/m  Physical Exam Vitals and nursing note reviewed.  Constitutional:      General: He is not in acute distress.    Appearance: Normal appearance. He is not ill-appearing.  HENT:     Head: Normocephalic and atraumatic.     Nose: Nose normal.  Eyes:     Conjunctiva/sclera: Conjunctivae normal.  Pulmonary:     Effort: Pulmonary effort is normal. No respiratory distress.  Musculoskeletal:        General: No deformity. Normal range of motion.     Cervical back: Normal range of motion.     Comments: Cervical, thoracic and lumbar spine without step-offs.  There is some tenderness palpation over thoracic and lumbar spine.  Full range of motion bilateral upper and lower extremities.  All major joints without tenderness palpation and good range  of motion.  Skin:    Findings: No rash.  Neurological:     Mental Status: He is alert.     ED Results / Procedures / Treatments   Labs (all labs ordered are listed, but only abnormal results are displayed) Labs Reviewed - No data to display  EKG None  Radiology No results found.  Procedures Procedures    Medications Ordered in ED Medications  oxyCODONE-acetaminophen (PERCOCET/ROXICET) 5-325 MG per tablet 1 tablet (1 tablet Oral Given 07/04/23 1637)    ED Course/ Medical Decision Making/ A&P                                 Medical Decision Making Risk Prescription drug management.   22 year old male presents with his sister today following a fall that occurred just prior to arrival.  He fell  off of a ladder falling from a height of about 16 feet.  He fell directly onto the ladder striking his back first.  No head injury.  No loss of consciousness.  Denies any other injuries.  Has full range of motion in all of his extremities.  He is without radicular symptoms.  Neurovascularly intact.  CT cervical spine, thoracic spine, lumbar spine obtained.  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  Discussed with neurosurgery.  They recommend TLSO brace and follow-up outpatient.  Patient prior to discharge wanted to change his pharmacy due to [facility] pharmacy closes at 7 PM.  Currently 7:34 PM.  Advised nurse to call [facility] pharmacy to leave a voicemail to cancel the active prescription for oxycodone.  Sent to [facility].   Final Clinical Impression(s) / ED Diagnoses Final diagnoses:  Closed fracture of lumbar vertebral body (HCC)    Rx / DC Orders ED Discharge Orders          Ordered    oxyCODONE (ROXICODONE) 5 MG immediate release tablet  Every 4 hours PRN        07/04/23 1903              Marita Kansas, PA-C 07/04/23 1907    Marita Kansas, PA-C 07/04/23 1935    Laurence Spates, MD 07/05/23 [phone]

## 2023-07-04 NOTE — Discharge Instructions (Addendum)
Your workup today showed L1 vertebral body fracture with 40% height loss.  I discussed her case with neurosurgery.  They recommend brace and outpatient follow-up.  They will call to schedule appointment however have also attached information for you to call in case you do not hear from them.  If you have any concerning symptoms return to the emergency room.  This includes pain radiating down your legs, difficulty controlling your bowel or bladder.  Primarily take 1000 mg of Tylenol every 6-8 hours, you can also take ibuprofen 600 to 800 mg every 6-8 hours.  If you take 800 mg of ibuprofen please do so on a full stomach.  Reserve the pain medication for severe breakthrough pain.

## 2023-07-04 NOTE — ED Triage Notes (Signed)
Pt arrives via POV. Pt states around ago, he was approximately 4ft up on a ladder. He states the ladder slid out, causing him to fall backwards. Pt landed on his back. Pt endorses pain to back. Pt has noted thoracic and lumbar tenderness, no step off or deformity. CMS is intact. Pt AxOx4. Denies hitting his head. No loc

## 2023-07-06 ENCOUNTER — Encounter: Payer: Self-pay | Admitting: Surgery

## 2023-07-19 DIAGNOSIS — S32011A Stable burst fracture of first lumbar vertebra, initial encounter for closed fracture: Secondary | ICD-10-CM | POA: Diagnosis not present

## 2023-07-19 DIAGNOSIS — Z6833 Body mass index (BMI) 33.0-33.9, adult: Secondary | ICD-10-CM | POA: Diagnosis not present

## 2023-09-25 DIAGNOSIS — S32011A Stable burst fracture of first lumbar vertebra, initial encounter for closed fracture: Secondary | ICD-10-CM | POA: Diagnosis not present

## 2023-09-25 DIAGNOSIS — Z6837 Body mass index (BMI) 37.0-37.9, adult: Secondary | ICD-10-CM | POA: Diagnosis not present

## 2023-09-25 DIAGNOSIS — S32001D Stable burst fracture of unspecified lumbar vertebra, subsequent encounter for fracture with routine healing: Secondary | ICD-10-CM | POA: Diagnosis not present
